# Patient Record
Sex: Male | Born: 1990 | Race: White | Hispanic: No | Marital: Single | Smoking: Never smoker
Health system: Southern US, Community
[De-identification: ages and names within clinical notes are randomized; demographics above are authoritative.]

## PROBLEM LIST (undated history)

## (undated) DIAGNOSIS — C801 Malignant (primary) neoplasm, unspecified: Secondary | ICD-10-CM

## (undated) DIAGNOSIS — Z923 Personal history of irradiation: Secondary | ICD-10-CM

## (undated) HISTORY — DX: Personal history of irradiation: Z92.3

---
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
## 2010-04-20 ENCOUNTER — Other Ambulatory Visit (HOSPITAL_COMMUNITY): Payer: Self-pay | Admitting: Oncology

## 2010-04-20 DIAGNOSIS — C8590 Non-Hodgkin lymphoma, unspecified, unspecified site: Secondary | ICD-10-CM

## 2010-04-23 ENCOUNTER — Encounter: Payer: Self-pay | Admitting: Oncology

## 2010-05-12 ENCOUNTER — Encounter (HOSPITAL_BASED_OUTPATIENT_CLINIC_OR_DEPARTMENT_OTHER): Payer: 59 | Admitting: Oncology

## 2010-05-12 ENCOUNTER — Other Ambulatory Visit: Payer: Self-pay | Admitting: Oncology

## 2010-05-12 DIAGNOSIS — C8582 Other specified types of non-Hodgkin lymphoma, intrathoracic lymph nodes: Secondary | ICD-10-CM

## 2010-05-12 LAB — CBC WITH DIFFERENTIAL/PLATELET
Basophils Absolute: 0 10*3/uL (ref 0.0–0.1)
EOS%: 0.5 % (ref 0.0–7.0)
HCT: 47.1 % (ref 38.4–49.9)
HGB: 15.7 g/dL (ref 13.0–17.1)
MCH: 30.5 pg (ref 27.2–33.4)
MCV: 91.4 fL (ref 79.3–98.0)
NEUT%: 75.2 % — ABNORMAL HIGH (ref 39.0–75.0)
lymph#: 1.5 10*3/uL (ref 0.9–3.3)

## 2010-05-12 LAB — COMPREHENSIVE METABOLIC PANEL
AST: 22 U/L (ref 0–37)
Alkaline Phosphatase: 98 U/L (ref 39–117)
BUN: 16 mg/dL (ref 6–23)
Glucose, Bld: 96 mg/dL (ref 70–99)
Sodium: 141 mEq/L (ref 135–145)
Total Bilirubin: 1 mg/dL (ref 0.3–1.2)
Total Protein: 7.1 g/dL (ref 6.0–8.3)

## 2010-05-15 ENCOUNTER — Encounter (HOSPITAL_COMMUNITY): Payer: Self-pay

## 2010-05-15 ENCOUNTER — Encounter (HOSPITAL_COMMUNITY)
Admission: RE | Admit: 2010-05-15 | Discharge: 2010-05-15 | Disposition: A | Discharge status: Home / Self Care | Payer: 59 | Source: Ambulatory Visit | Attending: Oncology | Admitting: Oncology

## 2010-05-15 DIAGNOSIS — C8589 Other specified types of non-Hodgkin lymphoma, extranodal and solid organ sites: Secondary | ICD-10-CM | POA: Insufficient documentation

## 2010-05-15 DIAGNOSIS — C8590 Non-Hodgkin lymphoma, unspecified, unspecified site: Secondary | ICD-10-CM

## 2010-05-15 HISTORY — DX: Malignant (primary) neoplasm, unspecified: C80.1

## 2010-05-15 MED ORDER — FLUDEOXYGLUCOSE F - 18 (FDG) INJECTION
16.6000 | Freq: Once | INTRAVENOUS | Status: AC | PRN
  Administered 2010-05-15: 16.6 via INTRAVENOUS

## 2010-05-17 ENCOUNTER — Encounter: Payer: Self-pay | Admitting: Family Medicine

## 2010-05-17 ENCOUNTER — Encounter (HOSPITAL_BASED_OUTPATIENT_CLINIC_OR_DEPARTMENT_OTHER): Payer: 59 | Admitting: Oncology

## 2010-05-17 DIAGNOSIS — C8582 Other specified types of non-Hodgkin lymphoma, intrathoracic lymph nodes: Secondary | ICD-10-CM

## 2010-05-30 NOTE — Letter (Signed)
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

## 2010-06-19 LAB — COMPREHENSIVE METABOLIC PANEL
ALT: 25 U/L (ref 0–53)
CO2: 29 mEq/L (ref 19–32)
Calcium: 10.2 mg/dL (ref 8.4–10.5)
Creatinine, Ser: 0.92 mg/dL (ref 0.4–1.5)
GFR calc non Af Amer: >60 mL/min (ref >60)
Glucose, Bld: 74 mg/dL (ref 70–99)
Sodium: 142 mEq/L (ref 135–145)
Total Protein: 6.5 g/dL (ref 6.0–8.3)

## 2010-06-19 LAB — CBC
Hemoglobin: 15 g/dL (ref 13.0–17.0)
MCHC: 34.8 g/dL (ref 30.0–36.0)
MCV: 88.9 fL (ref 78.0–100.0)
RDW: 13 % (ref 11.5–15.5)

## 2010-06-19 LAB — APTT: aPTT: 31 seconds (ref 24–37)

## 2010-06-19 LAB — PROTIME-INR
INR: 1.12 (ref 0.00–1.49)
Prothrombin Time: 14.3 seconds (ref 11.6–15.2)

## 2010-06-26 LAB — GLUCOSE, CAPILLARY: Glucose-Capillary: 117 mg/dL — ABNORMAL HIGH (ref 70–99)

## 2010-07-06 LAB — CBC
HCT: 30 % — ABNORMAL LOW (ref 39.0–52.0)
HCT: 32 % — ABNORMAL LOW (ref 39.0–52.0)
HCT: 33.6 % — ABNORMAL LOW (ref 39.0–52.0)
HCT: 36 % — ABNORMAL LOW (ref 39.0–52.0)
HCT: 41.4 % (ref 39.0–52.0)
Hemoglobin: 11 g/dL — ABNORMAL LOW (ref 13.0–17.0)
Hemoglobin: 12.4 g/dL — ABNORMAL LOW (ref 13.0–17.0)
Hemoglobin: 13.4 g/dL (ref 13.0–17.0)
Hemoglobin: 14.1 g/dL (ref 13.0–17.0)
MCHC: 35.2 g/dL (ref 30.0–36.0)
MCHC: 34.7 g/dL (ref 30.0–36.0)
MCV: 85.6 fL (ref 78.0–100.0)
MCV: 88.4 fL (ref 78.0–100.0)
Platelets: 254 10*3/uL (ref 150–400)
Platelets: 260 10*3/uL (ref 150–400)
Platelets: 229 10*3/uL (ref 150–400)
Platelets: 260 10*3/uL (ref 150–400)
RBC: 3.5 MIL/uL — ABNORMAL LOW (ref 4.22–5.81)
RBC: 3.7 MIL/uL — ABNORMAL LOW (ref 4.22–5.81)
RBC: 3.8 MIL/uL — ABNORMAL LOW (ref 4.22–5.81)
RBC: 3.8 MIL/uL — ABNORMAL LOW (ref 4.22–5.81)
RBC: 4.53 MIL/uL (ref 4.22–5.81)
RBC: 4.68 MIL/uL (ref 4.22–5.81)
RDW: 11.6 % (ref 11.5–15.5)
RDW: 12.1 % (ref 11.5–15.5)
WBC: 5.9 10*3/uL (ref 4.0–10.5)
WBC: 6.2 10*3/uL (ref 4.0–10.5)
WBC: 6.8 10*3/uL (ref 4.0–10.5)
WBC: 13.6 10*3/uL — ABNORMAL HIGH (ref 4.0–10.5)
WBC: 7.9 10*3/uL (ref 4.0–10.5)
WBC: 9.7 10*3/uL (ref 4.0–10.5)

## 2010-07-06 LAB — COMPREHENSIVE METABOLIC PANEL
ALT: 26 U/L (ref 0–53)
ALT: 37 U/L (ref 0–53)
ALT: 14 U/L (ref 0–53)
ALT: 17 U/L (ref 0–53)
AST: 29 U/L (ref 0–37)
AST: 24 U/L (ref 0–37)
AST: 25 U/L (ref 0–37)
Albumin: 2.7 g/dL — ABNORMAL LOW (ref 3.5–5.2)
Albumin: 2.6 g/dL — ABNORMAL LOW (ref 3.5–5.2)
Albumin: 2.9 g/dL — ABNORMAL LOW (ref 3.5–5.2)
Albumin: 3.9 g/dL (ref 3.5–5.2)
Alkaline Phosphatase: 79 U/L (ref 39–117)
Alkaline Phosphatase: 79 U/L (ref 39–117)
Alkaline Phosphatase: 81 U/L (ref 39–117)
BUN: 15 mg/dL (ref 6–23)
BUN: 6 mg/dL (ref 6–23)
CO2: 25 mEq/L (ref 19–32)
CO2: 27 mEq/L (ref 19–32)
CO2: 30 mEq/L (ref 19–32)
Calcium: 8.6 mg/dL (ref 8.4–10.5)
Chloride: 107 mEq/L (ref 96–112)
Chloride: 107 mEq/L (ref 96–112)
Chloride: 107 mEq/L (ref 96–112)
Chloride: 101 mEq/L (ref 96–112)
Chloride: 103 mEq/L (ref 96–112)
Creatinine, Ser: 0.5 mg/dL (ref 0.4–1.5)
Creatinine, Ser: 0.7 mg/dL (ref 0.4–1.5)
Creatinine, Ser: 0.72 mg/dL (ref 0.4–1.5)
Creatinine, Ser: 1.1 mg/dL (ref 0.4–1.5)
GFR calc Af Amer: >60 mL/min (ref >60)
GFR calc Af Amer: >60 mL/min (ref >60)
GFR calc Af Amer: >60 mL/min (ref >60)
GFR calc non Af Amer: >60 mL/min (ref >60)
GFR calc non Af Amer: >60 mL/min (ref >60)
GFR calc non Af Amer: >60 mL/min (ref >60)
Glucose, Bld: 117 mg/dL — ABNORMAL HIGH (ref 70–99)
Glucose, Bld: 108 mg/dL — ABNORMAL HIGH (ref 70–99)
Potassium: 3.8 mEq/L (ref 3.5–5.1)
Potassium: 3.9 mEq/L (ref 3.5–5.1)
Potassium: 3.3 mEq/L — ABNORMAL LOW (ref 3.5–5.1)
Potassium: 4 mEq/L (ref 3.5–5.1)
Sodium: 139 mEq/L (ref 135–145)
Sodium: 137 mEq/L (ref 135–145)
Sodium: 140 mEq/L (ref 135–145)
Total Bilirubin: 0.4 mg/dL (ref 0.3–1.2)
Total Bilirubin: 0.5 mg/dL (ref 0.3–1.2)
Total Bilirubin: 0.7 mg/dL (ref 0.3–1.2)
Total Bilirubin: 0.4 mg/dL (ref 0.3–1.2)
Total Bilirubin: 0.6 mg/dL (ref 0.3–1.2)
Total Protein: 5.8 g/dL — ABNORMAL LOW (ref 6.0–8.3)
Total Protein: 5.5 g/dL — ABNORMAL LOW (ref 6.0–8.3)

## 2010-07-06 LAB — BLOOD GAS, ARTERIAL
Bicarbonate: 24.3 mEq/L — ABNORMAL HIGH (ref 20.0–24.0)
TCO2: 25.4 mmol/L (ref 0–100)
pCO2 arterial: 37.1 mmHg (ref 35.0–45.0)
pH, Arterial: 7.432 (ref 7.350–7.450)
pO2, Arterial: 115 mmHg — ABNORMAL HIGH (ref 80.0–100.0)

## 2010-07-06 LAB — DIFFERENTIAL
Band Neutrophils: 0 % (ref 0–10)
Blasts: 0 %
Metamyelocytes Relative: 0 %
Monocytes Absolute: 0.4 10*3/uL (ref 0.1–1.0)
Myelocytes: 0 %
Promyelocytes Absolute: 0 %
nRBC: 0 /100 WBC

## 2010-07-06 LAB — LACTATE DEHYDROGENASE
LDH: 212 U/L (ref 94–250)
LDH: 230 U/L (ref 94–250)

## 2010-07-06 LAB — URIC ACID
Uric Acid, Serum: 3.1 mg/dL — ABNORMAL LOW (ref 4.0–7.8)
Uric Acid, Serum: 3.5 mg/dL — ABNORMAL LOW (ref 4.0–7.8)

## 2010-07-06 LAB — CHROMOSOME ANALYSIS, BONE MARROW

## 2010-07-06 LAB — BASIC METABOLIC PANEL
Calcium: 9.3 mg/dL (ref 8.4–10.5)
GFR calc Af Amer: >60 mL/min (ref >60)
GFR calc non Af Amer: >60 mL/min (ref >60)
GFR calc non Af Amer: >60 mL/min (ref >60)
Glucose, Bld: 118 mg/dL — ABNORMAL HIGH (ref 70–99)
Potassium: 4.1 mEq/L (ref 3.5–5.1)
Sodium: 134 mEq/L — ABNORMAL LOW (ref 135–145)
Sodium: 139 mEq/L (ref 135–145)

## 2010-07-06 LAB — ABO/RH: ABO/RH(D): O POS

## 2010-07-06 LAB — CROSSMATCH: Antibody Screen: NEGATIVE

## 2010-07-06 LAB — BONE MARROW EXAM

## 2010-07-06 LAB — PROTIME-INR: INR: 1.18 (ref 0.00–1.49)

## 2010-07-06 LAB — GLUCOSE, CAPILLARY: Glucose-Capillary: 96 mg/dL (ref 70–99)

## 2010-08-15 NOTE — Assessment & Plan Note (Signed)
OFFICE VISIT   Shevlin, Daniel Campbell  DOB:  28-Dec-1990                                        September 02, 2009  CHART #:  [id number]   The patient came for follow today.  His blood pressure is 114/66, pulse  90, respirations 16, sats 100.  He is doing well overall.  His Port-A-  Cath site looks good.  It is healing well where we removed the Port-A-  Cath yesterday.  I will see him back again if he has any future problem.   Ines Bloomer, M.D.  Electronically Signed   DPB/MEDQ  D:  09/02/2009  T:  09/03/2009  Job:  [id number]

## 2010-08-15 NOTE — Letter (Signed)
March 22, 2009   Drue Second, MD  [address] [place], [place] [address]   Re:  DERMOT, GREMILLION             DOB:  May 11, 1990   Dear Konrad Dolores,   I saw the patient in our office today and he looks great.  His Port-A-  Cath site and his pericardial incision site look well.  His chest x-ray  shows complete resolution of his mediastinal mass.  It looks like he has  gotten a good response to his chemotherapy and he has 2 more cycles to  go.  His blood pressure was 130/79, pulse 100, respirations 18, and sats  were 99%.  I will see him back again when he needs to have his Port-A-  Cath removed.   Sincerely,   Ines Bloomer, M.D.  Electronically Signed   DPB/MEDQ  D:  03/22/2009  T:  03/22/2009  Job:  [id number]

## 2010-08-15 NOTE — Letter (Signed)
January 05, 2009   Drue Second, MD  [address] [place], [place] [address]   Re:  ZAY, YEARGAN             DOB:  1991/01/14   Dear Konrad Dolores,   I appreciate the opportunity of seeing the patient.  This 20 year old  Caucasian male was having some chest pain and bronchitis and went to the  [facility] where a chest x-ray was obtained and showed  that he had a large mediastinal mass.  A CT scan was done at [facility], which  showed a 15 x 8.3 x 11.7 mass in the superior mediastinum.  There is no  evidence of any other adenopathy.  He was also noticed to have  apparently a murmur and at the same time he used to complain of a cough  and had previous physicals with no evidence of a murmur and he  complained of night sweats, no weight loss and no change in his energy  level.   MEDICATIONS:  He has been on minocycline and he has no allergies.   PAST MEDICAL HISTORY:  Noncontributory.   FAMILY HISTORY:  Noncontributory.   SOCIAL HISTORY:  He is single.  Occasional alcohol intake.   REVIEW OF SYSTEMS:  GENERAL:  He is 127 pounds.  He is 5 feet 8 inches.  He does say that he has some mild weight loss.  HEART:  He has had a heart murmur, and shortness of breath.  PULMONARY:  No hemoptysis or asthma.  GI:  No nausea, vomiting, constipation, or diarrhea.  GU:  No kidney disease, dysuria or frequent urination.  VASCULAR:  No claudication, DVT, or TIAs.  NEUROLOGIC:  No dizziness, headaches, blackouts, or seizures.  MUSCULOSKELETAL:  No arthritis or joint pain.  PSYCHIATRIC:  No depression or nervousness.  EYE/ENT:  No change in his eyesight or hearing.  HEMATOLOGIC:  No problems with bleeding, clotting disorders or anemia.   PHYSICAL EXAMINATION:  General:  He is a well-developed, Caucasian male  in no acute distress.  Head, Eyes, Ears, Nose and Throat:  Unremarkable.  Neck:  Supple without thyromegaly.  There is no supraclavicular or  axillary adenopathy.  Chest:  Clear to  auscultation and percussion.  Heart:  Regular sinus rhythm.  Questionable early systolic ejection  murmur.  Abdomen:  Soft.  Bowel sounds are normal.  Extremities:  Pulses  are 2+.  No clubbing or edema.  Neurologic:  She is oriented x3.  Sensory and motor intact.  Cranial nerves intact.   IMPRESSION:  I think he probably has either a thymoma or Hodgkin's or  non-Hodgkin lymphoma, germ cell tumor or possible teratoma.  He will get  a PET scan tomorrow.  He will see Dr. Antoine Poche for a cardiac evaluation  after a 2-D echo and then plan to do a left mediastinotomy on Monday for  diagnosis.  I appreciate the opportunity of seeing the patient.   Sincerely,   Ines Bloomer, M.D.  Electronically Signed   DPB/MEDQ  D:  01/05/2009  T:  01/06/2009  Job:  [id number]   cc:   Rollene Rotunda, MD, [facility]  Crawford Givens, MD

## 2010-08-15 NOTE — Letter (Signed)
January 25, 2009   Drue Second, MD  [address] [place], [place] [address]   Re:  Daniel Campbell, Daniel Campbell             DOB:  [birth year]   Dear Konrad Dolores,   The patient came for suture removal today.  His incisions were well  healed.  We released him to full activities.  Blood pressure is 116/69,  pulse 78, respirations 18, sats were 99%.  Chest x-ray showed marked  reduction in the mediastinal mass, so he has responded to his first  treatment and chemotherapy.  I will see him back again in 4-6 weeks with  a chest x-ray for final check.   Ines Bloomer, M.D.  Electronically Signed   DPB/MEDQ  D:  01/25/2009  T:  01/26/2009  Job:  [id number]

## 2010-10-25 ENCOUNTER — Other Ambulatory Visit: Payer: Self-pay | Admitting: Oncology

## 2010-10-25 DIAGNOSIS — C8590 Non-Hodgkin lymphoma, unspecified, unspecified site: Secondary | ICD-10-CM

## 2010-10-30 ENCOUNTER — Encounter (HOSPITAL_BASED_OUTPATIENT_CLINIC_OR_DEPARTMENT_OTHER): Payer: 59 | Admitting: Oncology

## 2010-10-30 ENCOUNTER — Other Ambulatory Visit: Payer: Self-pay | Admitting: Oncology

## 2010-10-30 ENCOUNTER — Encounter (HOSPITAL_COMMUNITY): Payer: Self-pay

## 2010-10-30 ENCOUNTER — Ambulatory Visit (HOSPITAL_COMMUNITY)
Admission: RE | Admit: 2010-10-30 | Discharge: 2010-10-30 | Disposition: A | Discharge status: Home / Self Care | Payer: 59 | Source: Ambulatory Visit | Attending: Oncology | Admitting: Oncology

## 2010-10-30 DIAGNOSIS — C8589 Other specified types of non-Hodgkin lymphoma, extranodal and solid organ sites: Secondary | ICD-10-CM | POA: Insufficient documentation

## 2010-10-30 DIAGNOSIS — C8582 Other specified types of non-Hodgkin lymphoma, intrathoracic lymph nodes: Secondary | ICD-10-CM

## 2010-10-30 DIAGNOSIS — C8590 Non-Hodgkin lymphoma, unspecified, unspecified site: Secondary | ICD-10-CM

## 2010-10-30 LAB — CMP (CANCER CENTER ONLY)
ALT(SGPT): 24 U/L (ref 10–47)
AST: 26 U/L (ref 11–38)
Albumin: 3.9 g/dL (ref 3.3–5.5)
Alkaline Phosphatase: 94 U/L — ABNORMAL HIGH (ref 26–84)
Potassium: 4.6 mEq/L (ref 3.3–4.7)
Sodium: 141 mEq/L (ref 128–145)
Total Protein: 7 g/dL (ref 6.4–8.1)

## 2010-10-30 LAB — CBC WITH DIFFERENTIAL/PLATELET
EOS%: 3.3 % (ref 0.0–7.0)
MCH: 31.3 pg (ref 27.2–33.4)
MCV: 90 fL (ref 79.3–98.0)
MONO%: 6.5 % (ref 0.0–14.0)
NEUT#: 3.5 10*3/uL (ref 1.5–6.5)
RBC: 4.96 10*6/uL (ref 4.20–5.82)
RDW: 12.7 % (ref 11.0–14.6)

## 2010-10-30 MED ORDER — IOHEXOL 300 MG/ML  SOLN
100.0000 mL | Freq: Once | INTRAMUSCULAR | Status: AC | PRN
  Administered 2010-10-30: 100 mL via INTRAVENOUS

## 2010-10-31 ENCOUNTER — Other Ambulatory Visit: Payer: Self-pay | Admitting: Hematology & Oncology

## 2010-10-31 ENCOUNTER — Encounter (HOSPITAL_BASED_OUTPATIENT_CLINIC_OR_DEPARTMENT_OTHER): Payer: 59

## 2010-10-31 DIAGNOSIS — C8582 Other specified types of non-Hodgkin lymphoma, intrathoracic lymph nodes: Secondary | ICD-10-CM

## 2010-12-11 IMAGING — CR DG CHEST 2V
2 series · 2 of 2 positions shown · non-contrast
Comparison: PET CT 01/06/2009

CLINICAL DATA: Preoperative, mediastinal mass

CHEST - 2 VIEW

[w chest pa]
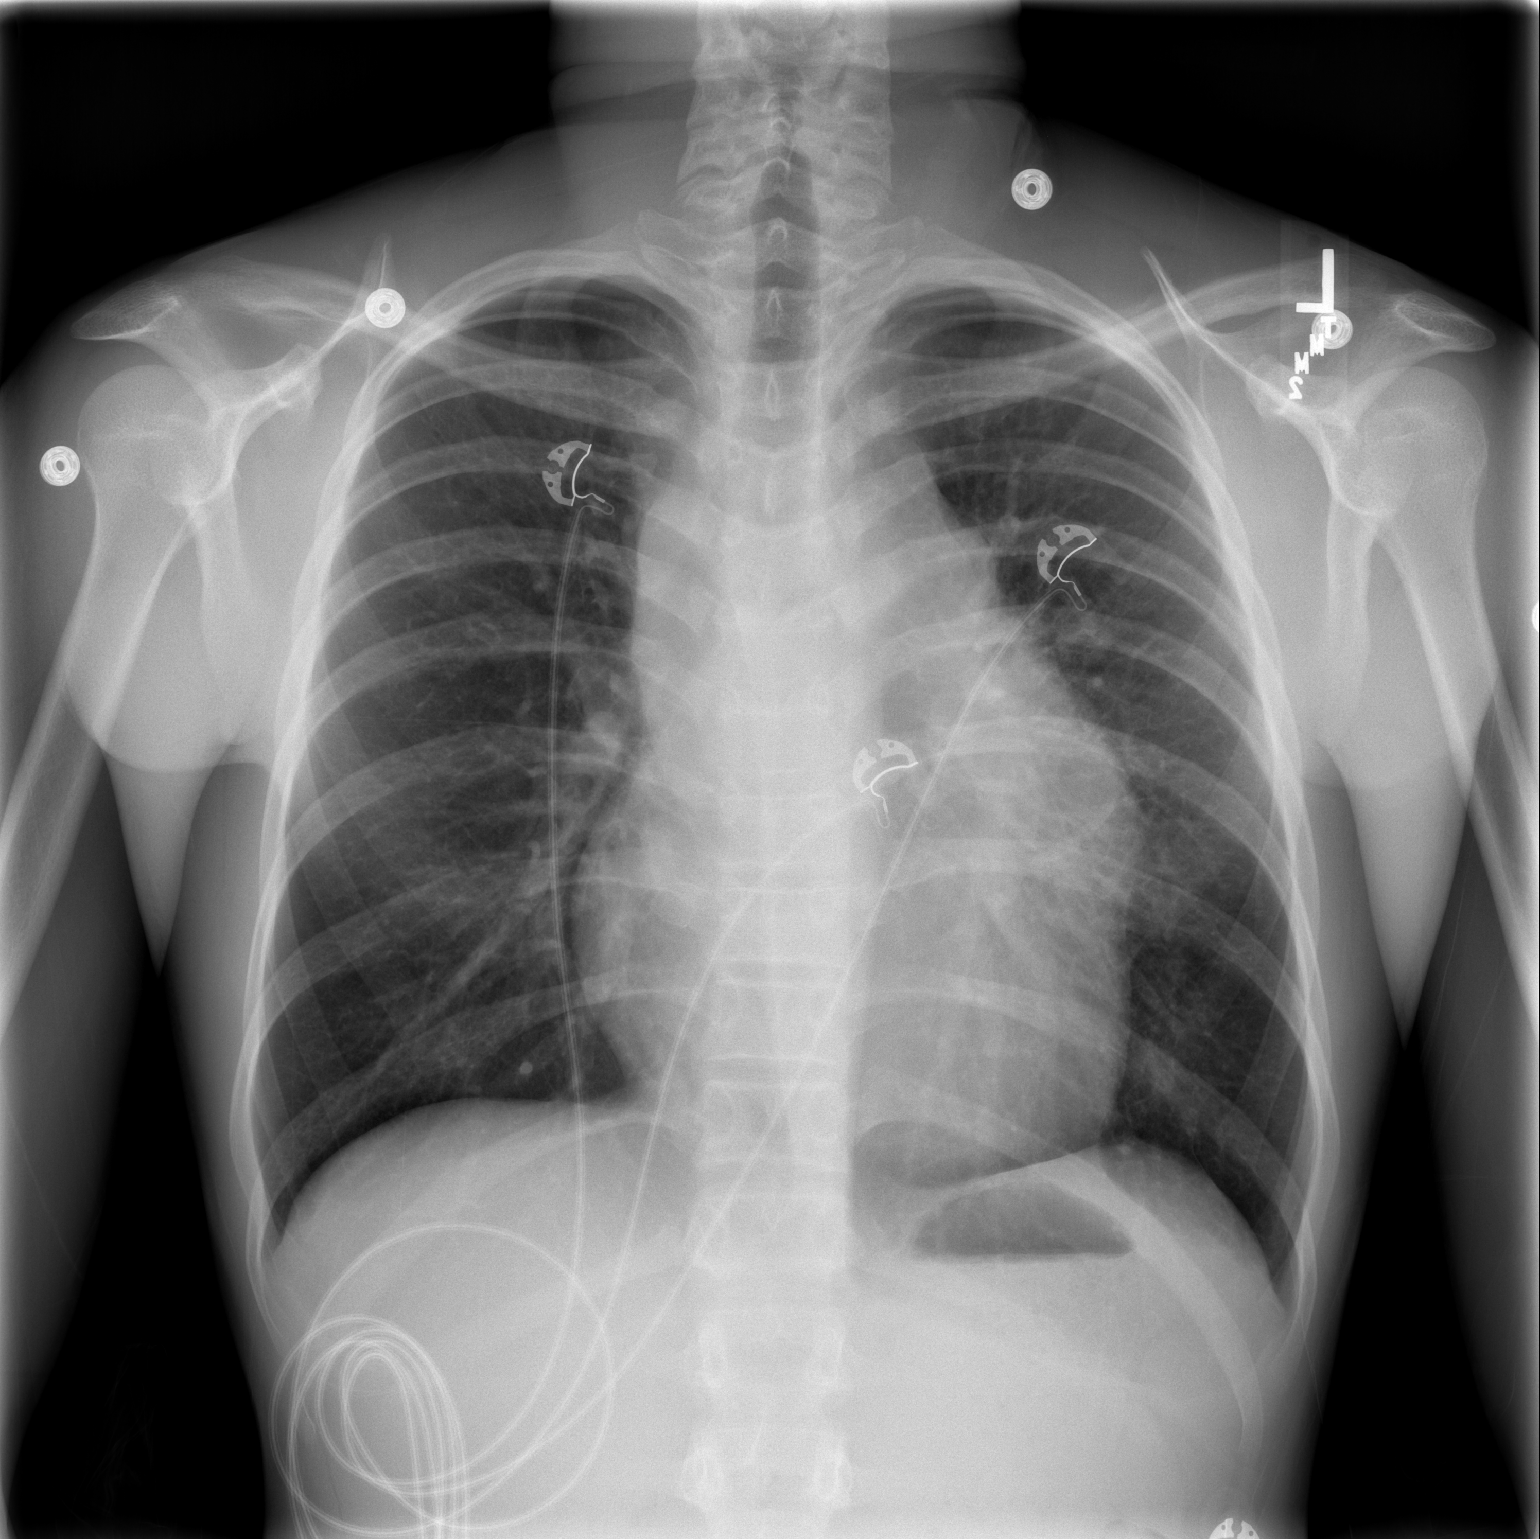

[w chest lat]
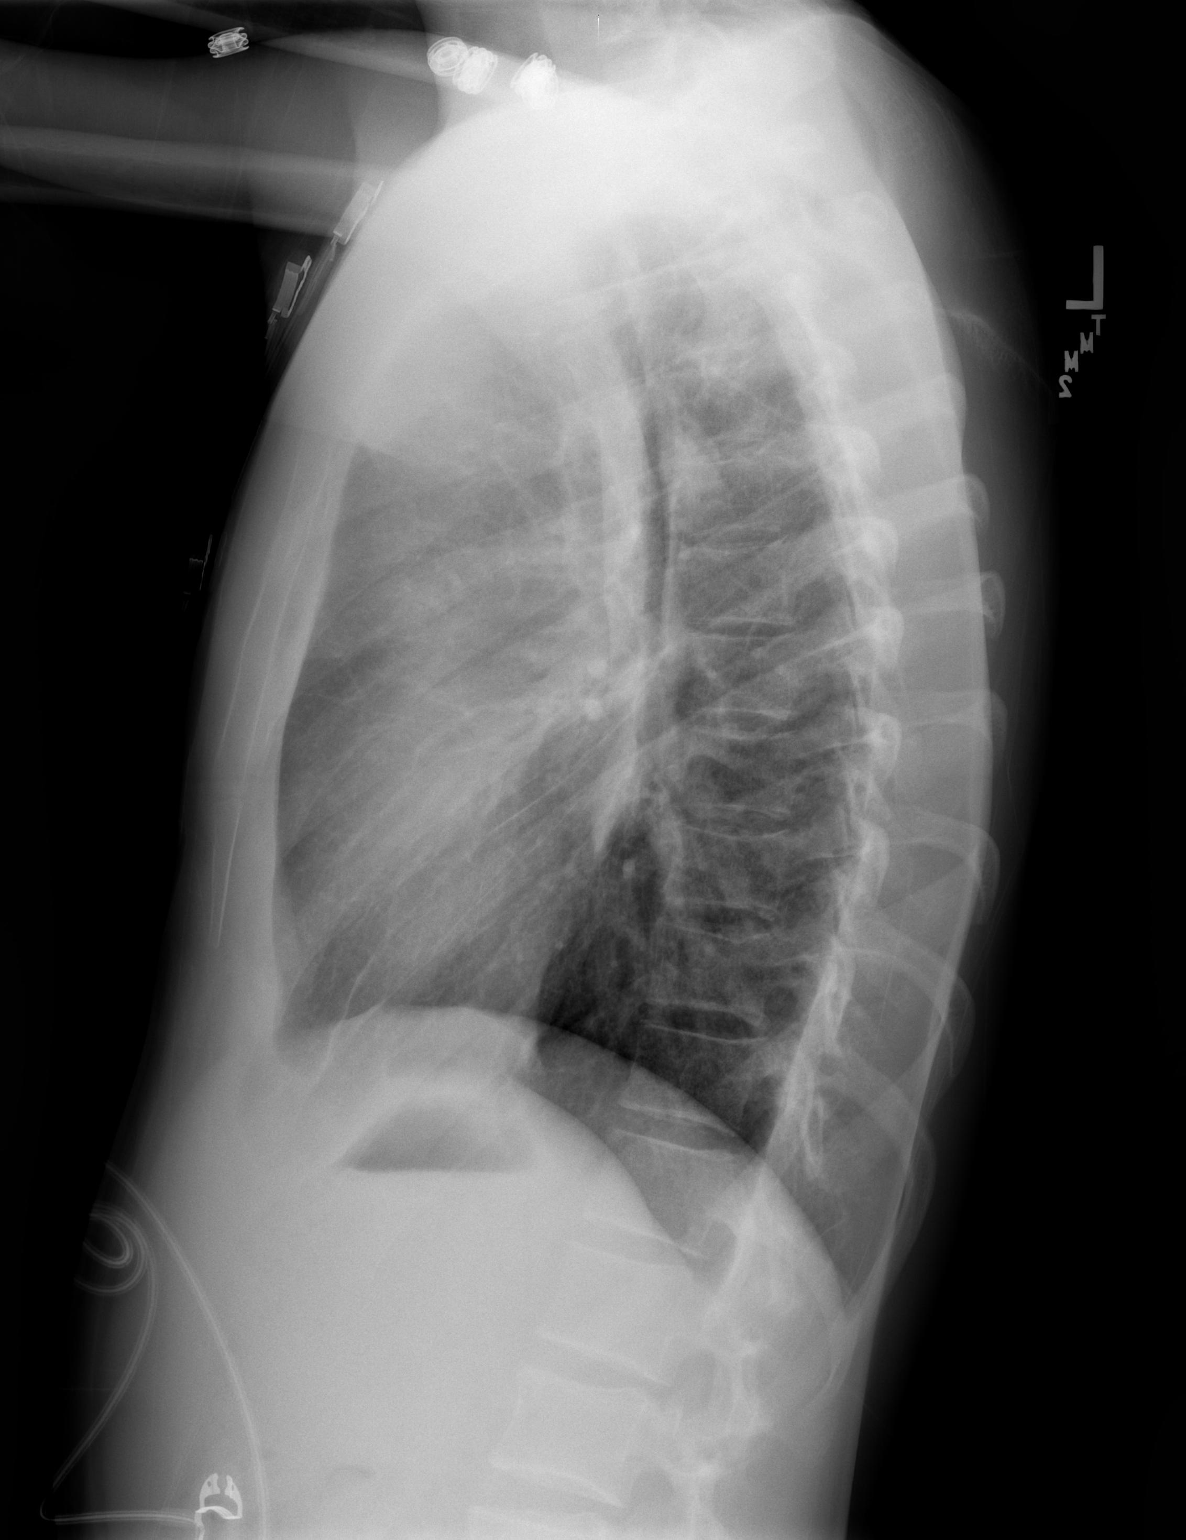

[2 of 2 positions shown; findings below may reference images not displayed]

FINDINGS: Abnormal cardiomediastinal silhouette is compatible with
previously seen anterior mediastinal mass.  Lungs are clear.  No
pleural effusion.
IMPRESSION: No acute finding.  Anterior mediastinal mass again noted.

## 2010-12-13 IMAGING — CT CT PELVIS W/ CM
2 of 4 series · 14 of 32 positions shown, 19 images · IV contrast (omnipaque)
Comparison: PET CT 01/06/2009

CT ABDOMEN

CLINICAL DATA: Mediastinal mass status post excision, staging of
seminoma

CT ABDOMEN AND PELVIS WITH CONTRAST
TECHNIQUE: Multidetector CT imaging of the abdomen and pelvis was
performed using the standard protocol following bolus
administration of intravenous contrast. Breast shield utilized.
Sagittal and coronal MPR images reconstructed from axial data set.
Contrast: Dilute oral contrast and 80 ml Omnipaque 300

[Series 2: routine abdomen · axial · 0.69mm/px · z∈[-403,-68]mm · 8 of 87 slices shown, 13 images]
[im 10/87  soft-tissue]
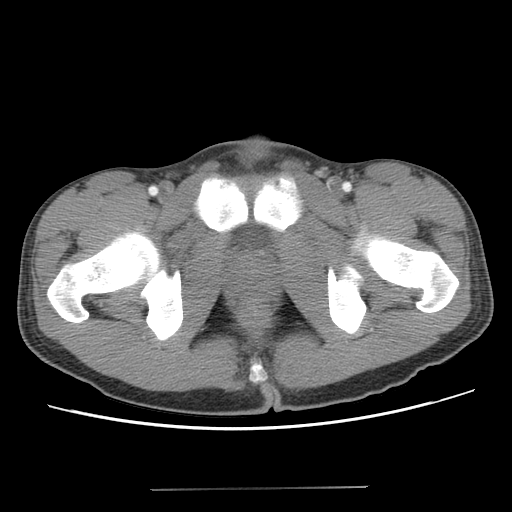
[im 10/87  bone]
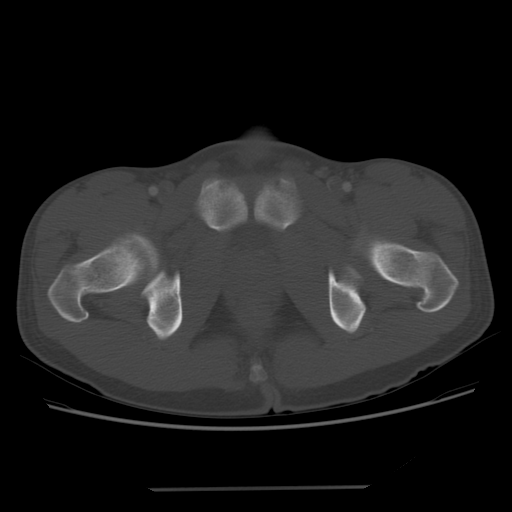
[im 20/87  soft-tissue]
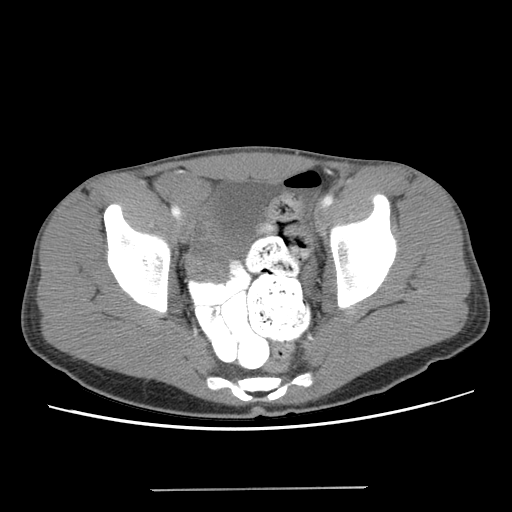
[im 29/87  soft-tissue]
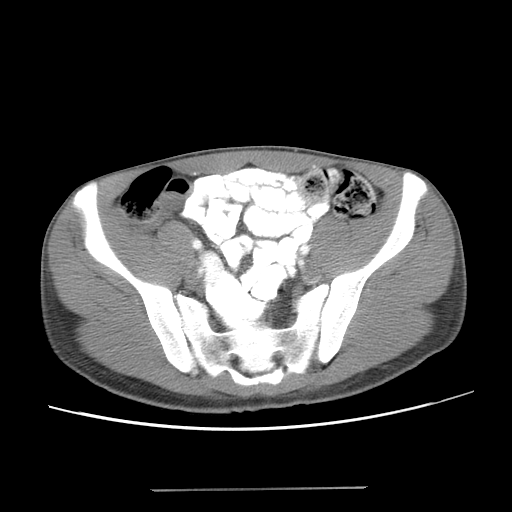
[im 39/87  soft-tissue]
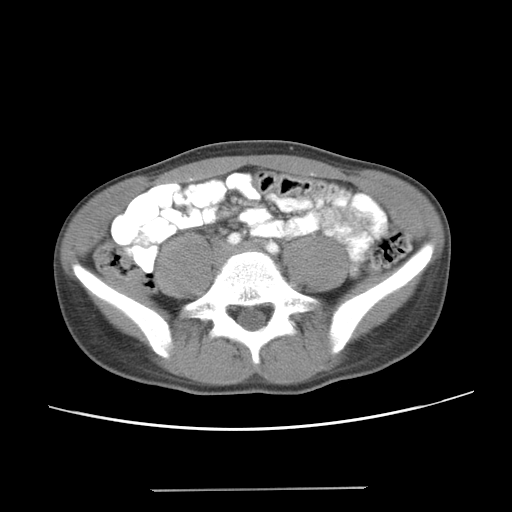
[im 48/87  soft-tissue]
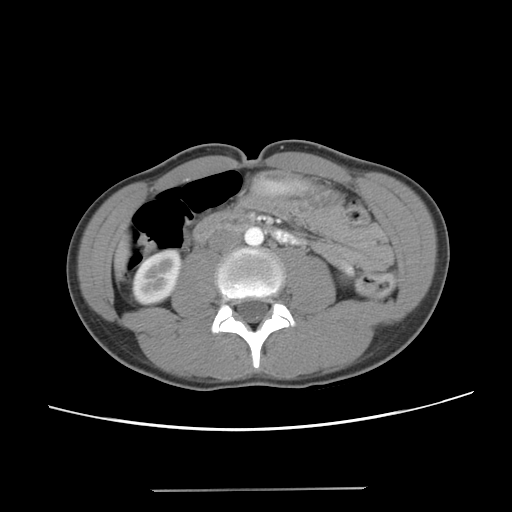
[im 48/87  lung]
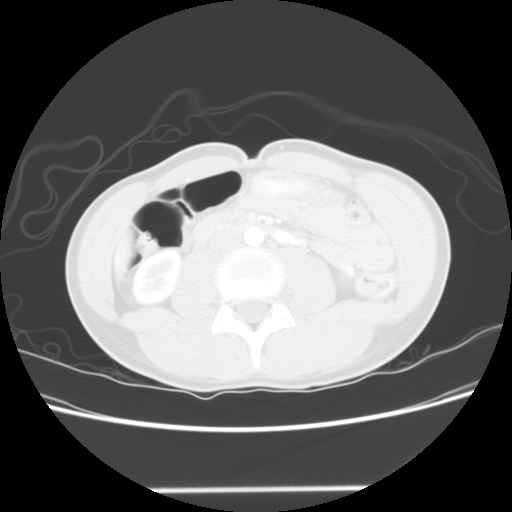
[im 58/87  soft-tissue]
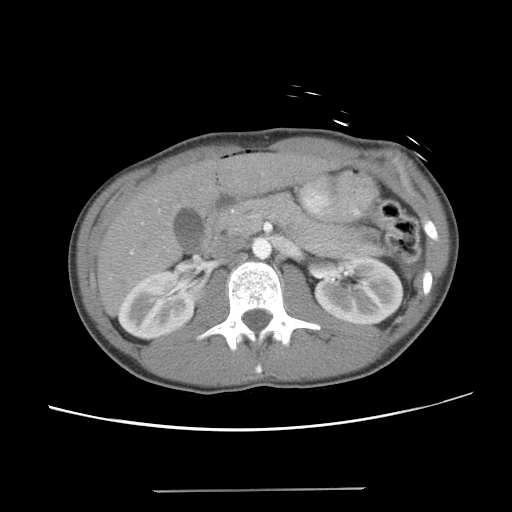
[im 58/87  lung]
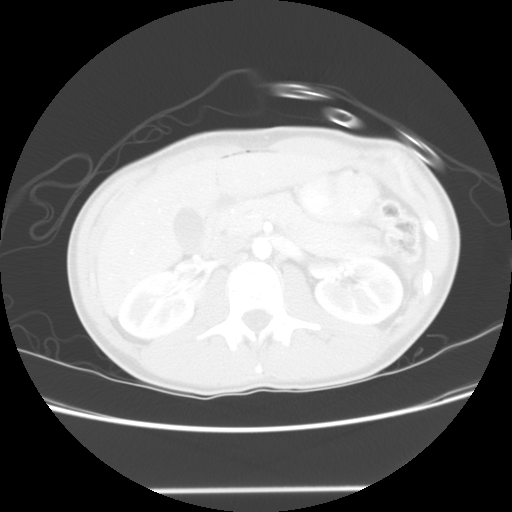
[im 67/87  soft-tissue]
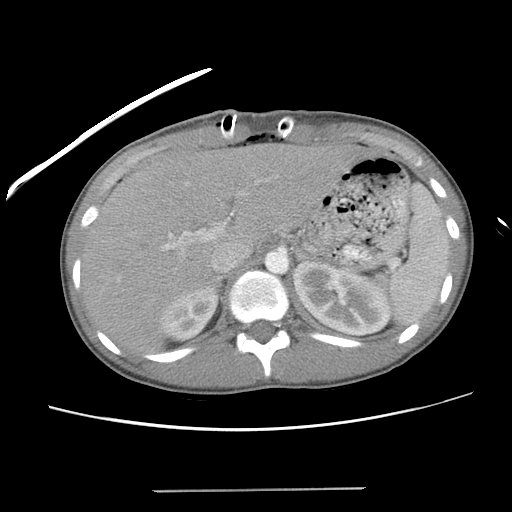
[im 67/87  lung]
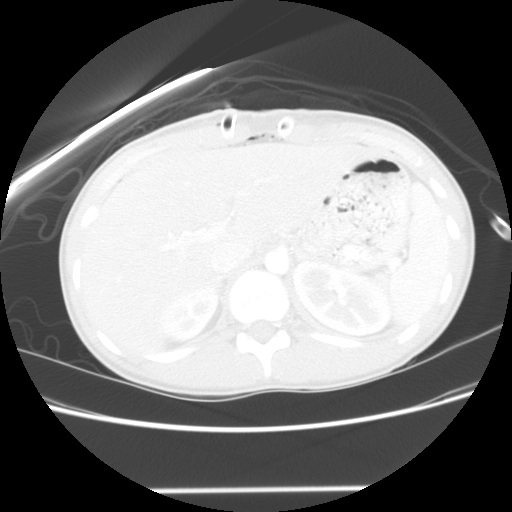
[im 77/87  soft-tissue]
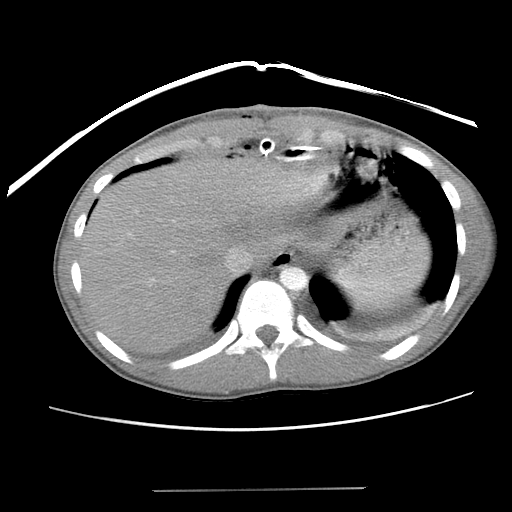
[im 77/87  lung]
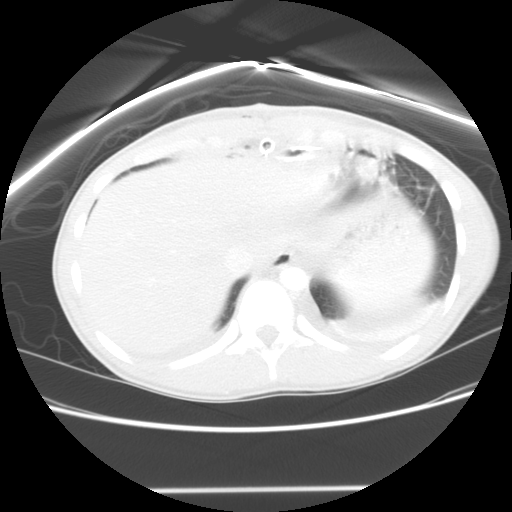

[Series 400: reformatted · sagittal · 0.86mm/px · 6 of 88 slices shown]
[im 10/88  soft-tissue]
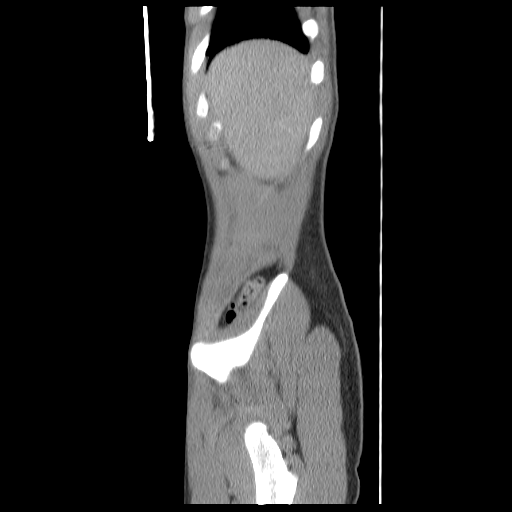
[im 20/88  soft-tissue]
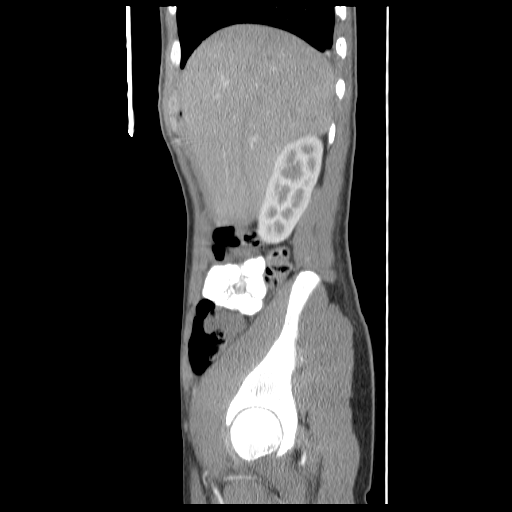
[im 30/88  soft-tissue]
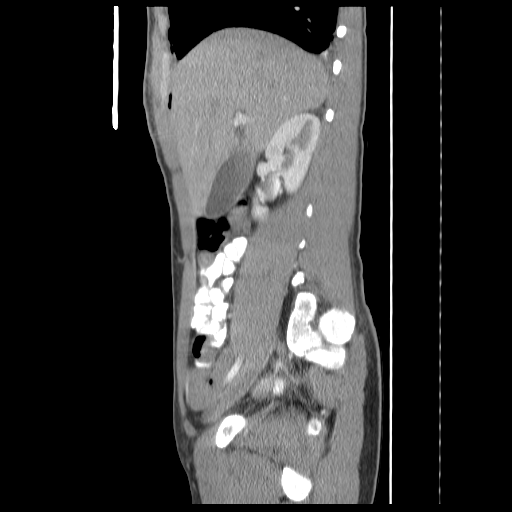
[im 39/88  soft-tissue]
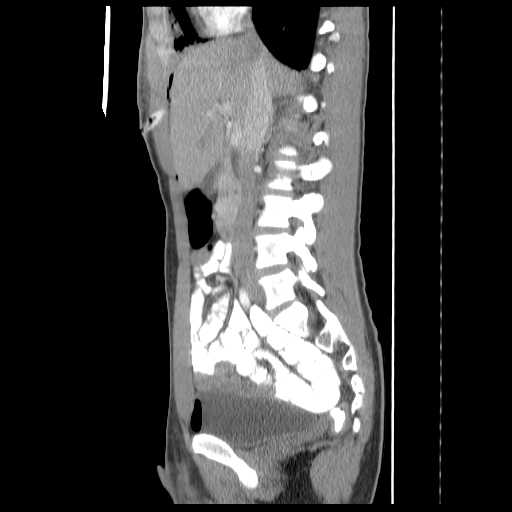
[im 49/88  soft-tissue]
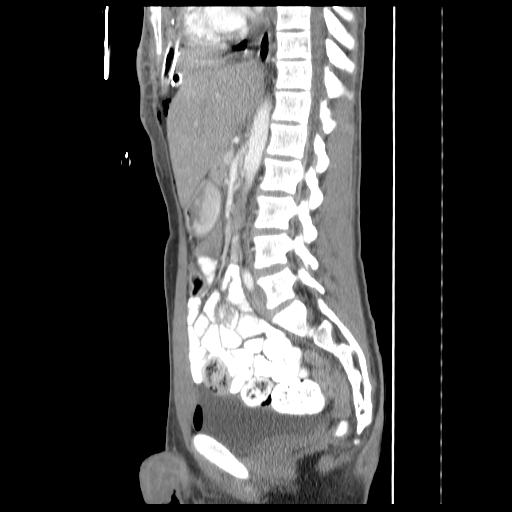
[im 59/88  soft-tissue]
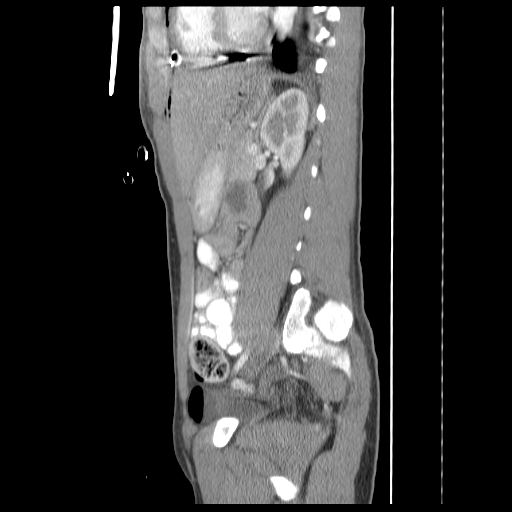

[14 of 32 positions shown; findings below may reference images not displayed]

FINDINGS: Mediastinal drainage catheters, soft tissue swelling and gas from
mediastinal tumor excision.
Bibasilar atelectasis and small left pleural effusion.
Tiny foci of free air seen anterior to the liver, likely related to
mediastinal drain placement.
Liver, spleen, pancreas, kidneys, and adrenal glands normal.
Stomach and upper abdominal bowel loops grossly unremarkable.
No upper abdominal mass, adenopathy, free fluid or inflammatory
process.
A few scattered normal-sized retroperitoneal lymph nodes are seen.
None of these were metabolically active on preceding PET CT.
IMPRESSION: Postsurgical changes inferior mediastinum.
No acute upper abdominal abnormalities.

CT PELVIS
FINDINGS: Large and small bowel loops in pelvis unremarkable.
Air in urinary bladder question prior instrumentation.
No pelvic mass, adenopathy, or free fluid.
Specifically no external iliac or inguinal adenopathy are
identified.
No focal bony abnormalities.
IMPRESSION: Negative CT pelvis.

## 2011-05-18 ENCOUNTER — Ambulatory Visit (HOSPITAL_COMMUNITY)
Admission: RE | Admit: 2011-05-18 | Discharge: 2011-05-18 | Disposition: A | Discharge status: Home / Self Care | Payer: 59 | Source: Ambulatory Visit | Attending: Hematology & Oncology | Admitting: Hematology & Oncology

## 2011-05-18 ENCOUNTER — Other Ambulatory Visit: Payer: 59 | Admitting: Lab

## 2011-05-18 ENCOUNTER — Encounter (HOSPITAL_COMMUNITY): Payer: Self-pay

## 2011-05-18 DIAGNOSIS — Z9221 Personal history of antineoplastic chemotherapy: Secondary | ICD-10-CM | POA: Insufficient documentation

## 2011-05-18 DIAGNOSIS — C8589 Other specified types of non-Hodgkin lymphoma, extranodal and solid organ sites: Secondary | ICD-10-CM | POA: Insufficient documentation

## 2011-05-18 DIAGNOSIS — Z923 Personal history of irradiation: Secondary | ICD-10-CM | POA: Insufficient documentation

## 2011-05-18 DIAGNOSIS — C8582 Other specified types of non-Hodgkin lymphoma, intrathoracic lymph nodes: Secondary | ICD-10-CM

## 2011-05-18 LAB — CBC WITH DIFFERENTIAL/PLATELET
Basophils Absolute: 0 10*3/uL (ref 0.0–0.1)
Eosinophils Absolute: 0.1 10*3/uL (ref 0.0–0.5)
HCT: 46.1 % (ref 38.4–49.9)
HGB: 15.9 g/dL (ref 13.0–17.1)
MONO#: 0.4 10*3/uL (ref 0.1–0.9)
NEUT%: 61 % (ref 39.0–75.0)
WBC: 7.4 10*3/uL (ref 4.0–10.3)
lymph#: 2.4 10*3/uL (ref 0.9–3.3)

## 2011-05-18 LAB — CMP (CANCER CENTER ONLY)
BUN, Bld: 13 mg/dL (ref 7–22)
CO2: 29 mEq/L (ref 18–33)
Calcium: 9.5 mg/dL (ref 8.0–10.3)
Chloride: 100 mEq/L (ref 98–108)
Creat: 0.9 mg/dl (ref 0.6–1.2)

## 2011-05-18 MED ORDER — IOHEXOL 300 MG/ML  SOLN
100.0000 mL | Freq: Once | INTRAMUSCULAR | Status: AC | PRN
  Administered 2011-05-18: 100 mL via INTRAVENOUS

## 2011-05-25 ENCOUNTER — Telehealth: Payer: Self-pay | Admitting: Oncology

## 2011-05-25 ENCOUNTER — Ambulatory Visit (HOSPITAL_BASED_OUTPATIENT_CLINIC_OR_DEPARTMENT_OTHER): Payer: 59 | Admitting: Oncology

## 2011-05-25 ENCOUNTER — Encounter: Payer: Self-pay | Admitting: Oncology

## 2011-05-25 DIAGNOSIS — C8582 Other specified types of non-Hodgkin lymphoma, intrathoracic lymph nodes: Secondary | ICD-10-CM

## 2011-05-25 DIAGNOSIS — C8592 Non-Hodgkin lymphoma, unspecified, intrathoracic lymph nodes: Secondary | ICD-10-CM | POA: Insufficient documentation

## 2011-05-25 DIAGNOSIS — I319 Disease of pericardium, unspecified: Secondary | ICD-10-CM

## 2011-05-25 NOTE — Telephone Encounter (Signed)
gv pt appt schedule for aug including scans.

## 2011-05-27 NOTE — Progress Notes (Signed)
  CC: Maryln Gottron, M.D. Ines Bloomer, M.D. 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  PAST THERAPY.:   1. Status post 6 cycles of CHOP-R administered from 01/15/2009 to 05/02/2009. 2. Status post radiation therapy administered from 06/20/2009 to 07/21/2009 to a total of 3170 cGy to mediastinum and 3230 cGy to the subxiphoid tumor bed.  CURRENT THERAPY:  Observation with surveillance scans.  INTERVAL HISTORY:  The patient is seen in followup today.  Clinically, he seems to be doing quite well.  He is without any significant complaints.  .  He has not had any problems whatsoever.  He denies any fevers, chills, night sweats, weight loss.  He has not noticed any masses in his neck, under his arms or the groin region.  He has no hearing loss of sinus infections.  No urinary incontinence.  Denies any back pain, joint pain, tingling or numbness in his extremities.  He has no bleeding problems and remainder of the 10-point review of systems is negative.  CURRENT MEDICATIONS:  None.  ALLERGIES:  No known drug allergies.  PERFORMANCE STATUS:  ECOG performance status is 0.  PHYSICAL EXAMINATION:  The patient is awake, alert, well-developed, well-nourished young male in no acute distress.  Vital Signs:  stable  HEENT:  EOMI.  PERLA.  Sclerae is anicteric.  No conjunctival pallor.  Oral mucosa is moist.  Neck:  Supple.  Lungs:  Clear bilaterally to auscultation and percussion.  Cardiovascular:  Regular rate and rhythm.  No murmurs, gallops or rubs.  Abdomen:  Soft, nontender, nondistended.  Bowel sounds are present.  No HSM.  Extremities:  No clubbing, edema or cyanosis.  Neuro:  Patient is alert, oriented x3.  Strength is symmetrical in upper and lower extremities.  LABORATORY DATA:  WBC 6.0, hemoglobin 15.5, hematocrit 44.6, platelets are 196,000.  RADIOGRAPHIC STUDIES:  CT of the chest, abdomen and  pelvis performed revealed no evidence of recurrent disease.  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  He received CHOP-R chemotherapy followed by consolidative radiation therapy.  All of his treatment was completed in April 2011.  His restaging scan revealed no evidence of recurrent disease.  The   patient overall seems to be doing extremely well.  He will continue to be seen every 6 months for the first 5 years.  We will do surveillance scans on his next visit as well.

## 2011-11-12 ENCOUNTER — Telehealth: Payer: Self-pay | Admitting: *Deleted

## 2011-11-12 NOTE — Telephone Encounter (Signed)
patient called in to reschedule the lab and scan til 11-20-2011 starting at 3:00pm with the labs

## 2011-11-16 ENCOUNTER — Other Ambulatory Visit (HOSPITAL_BASED_OUTPATIENT_CLINIC_OR_DEPARTMENT_OTHER): Payer: 59

## 2011-11-16 ENCOUNTER — Other Ambulatory Visit (HOSPITAL_COMMUNITY): Payer: 59

## 2011-11-20 ENCOUNTER — Ambulatory Visit (HOSPITAL_COMMUNITY)
Admission: RE | Admit: 2011-11-20 | Discharge: 2011-11-20 | Disposition: A | Discharge status: Home / Self Care | Payer: 59 | Source: Ambulatory Visit | Attending: Oncology | Admitting: Oncology

## 2011-11-20 ENCOUNTER — Other Ambulatory Visit: Payer: 59 | Admitting: Lab

## 2011-11-20 DIAGNOSIS — C8589 Other specified types of non-Hodgkin lymphoma, extranodal and solid organ sites: Secondary | ICD-10-CM | POA: Insufficient documentation

## 2011-11-20 DIAGNOSIS — M899 Disorder of bone, unspecified: Secondary | ICD-10-CM | POA: Insufficient documentation

## 2011-11-20 DIAGNOSIS — C8592 Non-Hodgkin lymphoma, unspecified, intrathoracic lymph nodes: Secondary | ICD-10-CM

## 2011-11-20 DIAGNOSIS — Z923 Personal history of irradiation: Secondary | ICD-10-CM | POA: Insufficient documentation

## 2011-11-20 DIAGNOSIS — C8582 Other specified types of non-Hodgkin lymphoma, intrathoracic lymph nodes: Secondary | ICD-10-CM

## 2011-11-20 DIAGNOSIS — Z9221 Personal history of antineoplastic chemotherapy: Secondary | ICD-10-CM | POA: Insufficient documentation

## 2011-11-20 LAB — CBC WITH DIFFERENTIAL/PLATELET
Eosinophils Absolute: 0.1 10*3/uL (ref 0.0–0.5)
HCT: 44.9 % (ref 38.4–49.9)
LYMPH%: 26.2 % (ref 14.0–49.0)
MONO#: 0.4 10*3/uL (ref 0.1–0.9)
NEUT#: 4.8 10*3/uL (ref 1.5–6.5)
NEUT%: 66.1 % (ref 39.0–75.0)
Platelets: 209 10*3/uL (ref 140–400)
WBC: 7.2 10*3/uL (ref 4.0–10.3)

## 2011-11-20 LAB — CMP (CANCER CENTER ONLY)
ALT(SGPT): 25 U/L (ref 10–47)
Albumin: 4.3 g/dL (ref 3.3–5.5)
BUN, Bld: 13 mg/dL (ref 7–22)
CO2: 31 mEq/L (ref 18–33)
Calcium: 9.4 mg/dL (ref 8.0–10.3)
Chloride: 101 mEq/L (ref 98–108)
Creat: 0.8 mg/dl (ref 0.6–1.2)
Potassium: 4.5 mEq/L (ref 3.3–4.7)

## 2011-11-20 LAB — LACTATE DEHYDROGENASE: LDH: 172 U/L (ref 94–250)

## 2011-11-20 MED ORDER — IOHEXOL 300 MG/ML  SOLN
80.0000 mL | Freq: Once | INTRAMUSCULAR | Status: AC | PRN
  Administered 2011-11-20: 80 mL via INTRAVENOUS

## 2011-11-22 ENCOUNTER — Ambulatory Visit (HOSPITAL_BASED_OUTPATIENT_CLINIC_OR_DEPARTMENT_OTHER): Payer: 59 | Admitting: Oncology

## 2011-11-22 ENCOUNTER — Telehealth: Payer: Self-pay | Admitting: Oncology

## 2011-11-22 ENCOUNTER — Encounter: Payer: Self-pay | Admitting: Oncology

## 2011-11-22 VITALS — BP 147/89 | HR 72 | Temp 97.7°F | Resp 20

## 2011-11-22 DIAGNOSIS — C8592 Non-Hodgkin lymphoma, unspecified, intrathoracic lymph nodes: Secondary | ICD-10-CM

## 2011-11-22 DIAGNOSIS — C8582 Other specified types of non-Hodgkin lymphoma, intrathoracic lymph nodes: Secondary | ICD-10-CM

## 2011-11-22 NOTE — Progress Notes (Signed)
  CC: Maryln Gottron, M.D. Ines Bloomer, M.D. Mauri Brooklyn, MD, Fax [phone]   DIAGNOSIS:  A 21 year old male with high-grade B-cell lymphoma of the mediastinum and pericardium.  PAST THERAPY.:   1. Status post 6 cycles of CHOP-R administered from 01/15/2009 to 05/02/2009. 2. Status post radiation therapy administered from 06/20/2009 to 07/21/2009 to a total of 3170 cGy to mediastinum and 3230 cGy to the subxiphoid tumor bed.  CURRENT THERAPY:  Observation with surveillance scans.  INTERVAL HISTORY:  The patient is seen in followup today.  Clinically, he seems to be doing quite well.  He is without any significant complaints. He has not had any problems whatsoever.  He denies any fevers, chills, night sweats, weight loss.  He has not noticed any masses in his neck, under his arms or the groin region.  He has no hearing loss of sinus infections.  No urinary incontinence.  Denies any back pain, joint pain, tingling or numbness in his extremities.  He has no bleeding problems and remainder of the 10-point review of systems is negative.  CURRENT MEDICATIONS:  None.  ALLERGIES:  No known drug allergies.  PERFORMANCE STATUS:  ECOG performance status is 0.  PHYSICAL EXAMINATION:  The patient is awake, alert, well-developed, well-nourished young male in no acute distress.  Vital Signs:  stable  HEENT:  EOMI.  PERLA.  Sclerae is anicteric.  No conjunctival pallor.  Oral mucosa is moist.  Neck:  Supple.  Lungs:  Clear bilaterally to auscultation and percussion.  Cardiovascular:  Regular rate and rhythm.  No murmurs, gallops or rubs.  Abdomen:  Soft, nontender, nondistended.  Bowel sounds are present.  No HSM.  Extremities:  No clubbing, edema or cyanosis.  Neuro:  Patient is alert, oriented x3.  Strength is symmetrical in upper and lower extremities.  LABORATORY DATA:  WBC 6.0, hemoglobin 15.5, hematocrit 44.6, platelets are 196,000.  RADIOGRAPHIC STUDIES:  CT of the chest, abdomen and pelvis  performed revealed no evidence of recurrent disease.  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  He received CHOP-R chemotherapy followed by consolidative radiation therapy.  All of his treatment was completed in April 2011.  His restaging scan revealed no evidence of recurrent disease.  The   patient overall seems to be doing extremely well.  He will continue to be seen every 6 months for the first 5 years.  We will do surveillance scans on his next visit as well.  Drue Second, MD Medical/Oncology [facility] [phone] (beeper) [phone] (Office)  11/22/2011, 3:08 PM

## 2011-11-22 NOTE — Patient Instructions (Addendum)
Doing well  I will see you in 6 months with labs and CT scan  *RADIOLOGY REPORT*  Clinical Data: Follow-up non-Hodgkins lymphoma. Chemotherapy and  radiation therapy completed.  CT CHEST, ABDOMEN AND PELVIS WITH CONTRAST  Technique: Multidetector CT imaging of the chest, abdomen and  pelvis was performed following the standard protocol during bolus  administration of intravenous contrast.  Contrast: 80mL OMNIPAQUE IOHEXOL 300 MG/ML SOLN  Comparison: Prior examinations 05/18/2011 and 10/30/2010.  CT CHEST  Findings: There are no enlarged mediastinal, hilar or axillary  lymph nodes. There is no pleural or pericardial effusion.  The lungs remain clear.  There is stable sclerosis of the right clavicular head at the  sternoclavicular joint. No suspicious osseous findings are seen.  IMPRESSION:  Stable chest CT. No evidence of recurrent lymphoma or active  process.  CT ABDOMEN AND PELVIS  Findings: The spleen remains normal in size and demonstrates no  focal abnormality. The liver, gallbladder, pancreas and biliary  system appear normal. The adrenal glands and kidneys appear  normal. There is no hydronephrosis.  No enlarged abdominal pelvic lymph nodes are demonstrated. There  are no bowel lesions. Vascular structures appear normal.  There are no suspicious osseous findings.  IMPRESSION:  Stable abdominal pelvic CT. No evidence of recurrent lymphoma or  active process.  Original Report Authenticated By: Gerrianne Scale, M.D.    CBC:  Lab 11/20/11 1509  WBC 7.2  NEUTROABS 4.8  HGB 15.3  HCT 44.9  MCV 92.0  PLT 209

## 2011-11-22 NOTE — Telephone Encounter (Signed)
Gave pt appt for February 2014 lab and Ct then MD visit , gave pt oral contrast and NPO 4 hours prior to CT

## 2012-05-23 ENCOUNTER — Other Ambulatory Visit (HOSPITAL_BASED_OUTPATIENT_CLINIC_OR_DEPARTMENT_OTHER): Payer: 59 | Admitting: Lab

## 2012-05-23 ENCOUNTER — Ambulatory Visit (HOSPITAL_COMMUNITY)
Admission: RE | Admit: 2012-05-23 | Discharge: 2012-05-23 | Disposition: A | Discharge status: Home / Self Care | Payer: 59 | Source: Ambulatory Visit | Attending: Oncology | Admitting: Oncology

## 2012-05-23 DIAGNOSIS — Z9221 Personal history of antineoplastic chemotherapy: Secondary | ICD-10-CM | POA: Insufficient documentation

## 2012-05-23 DIAGNOSIS — C8589 Other specified types of non-Hodgkin lymphoma, extranodal and solid organ sites: Secondary | ICD-10-CM | POA: Insufficient documentation

## 2012-05-23 DIAGNOSIS — C8592 Non-Hodgkin lymphoma, unspecified, intrathoracic lymph nodes: Secondary | ICD-10-CM

## 2012-05-23 LAB — COMPREHENSIVE METABOLIC PANEL (CC13)
Albumin: 4.5 g/dL (ref 3.5–5.0)
CO2: 28 mEq/L (ref 22–29)
Chloride: 102 mEq/L (ref 98–107)
Glucose: 97 mg/dl (ref 70–99)
Potassium: 4.5 mEq/L (ref 3.5–5.1)
Sodium: 141 mEq/L (ref 136–145)
Total Protein: 7.8 g/dL (ref 6.4–8.3)

## 2012-05-23 LAB — CBC WITH DIFFERENTIAL/PLATELET
Basophils Absolute: 0 10*3/uL (ref 0.0–0.1)
Eosinophils Absolute: 0.1 10*3/uL (ref 0.0–0.5)
HGB: 15.5 g/dL (ref 13.0–17.1)
MCV: 89.8 fL (ref 79.3–98.0)
NEUT#: 5.2 10*3/uL (ref 1.5–6.5)
RDW: 12.3 % (ref 11.0–14.6)
lymph#: 2.1 10*3/uL (ref 0.9–3.3)

## 2012-05-23 MED ORDER — IOHEXOL 300 MG/ML  SOLN
100.0000 mL | Freq: Once | INTRAMUSCULAR | Status: AC | PRN
  Administered 2012-05-23: 100 mL via INTRAVENOUS

## 2012-05-30 ENCOUNTER — Ambulatory Visit (HOSPITAL_BASED_OUTPATIENT_CLINIC_OR_DEPARTMENT_OTHER): Payer: 59 | Admitting: Oncology

## 2012-05-30 ENCOUNTER — Telehealth: Payer: Self-pay | Admitting: Oncology

## 2012-05-30 ENCOUNTER — Encounter: Payer: Self-pay | Admitting: Oncology

## 2012-05-30 VITALS — BP 126/7 | HR 98 | Temp 98.1°F | Resp 20 | Ht 68.0 in | Wt 153.0 lb

## 2012-05-30 NOTE — Patient Instructions (Addendum)
Doing great  I will see you back in 6 months  CT CHEST, ABDOMEN AND PELVIS WITH CONTRAST  Technique: Multidetector CT imaging of the chest, abdomen and  pelvis was performed following the standard protocol during bolus  administration of intravenous contrast.  Contrast: OMNIPAQUE IOHEXOL 300 MG/ML SOLN  Comparison: 09/20/2011  CT CHEST  Findings: The chest wall is unremarkable and stable. No  supraclavicular or axillary mass or adenopathy. The bony thorax is  intact. The thyroid gland is normal.  The heart is normal in size. No pericardial effusion. No  mediastinal or hilar lymphadenopathy. The esophagus is grossly  normal. The aorta is normal in caliber. No dissection.  Examination of the lung parenchyma demonstrates no acute pulmonary  findings. No pulmonary nodules or masses. No pleural effusion.  IMPRESSION:  Unremarkable and stable CT appearance of the chest. No findings  for lymphoma.  CT ABDOMEN AND PELVIS  Findings: The solid abdominal organs are normal. No splenomegaly.  The gallbladder is normal. No common bile duct dilatation.  The stomach, duodenum, small bowel and colon are unremarkable. The  appendix is normal. No mesenteric or retroperitoneal mass or  adenopathy. The aorta is normal in caliber. The major branch  vessels are normal.  The bladder, prostate gland and seminal vesicles are unremarkable.  No pelvic adenopathy. No inguinal adenopathy.  The bony structures are unremarkable.  IMPRESSION:  Unremarkable and stable CT appearance of the abdomen/pelvis. No  findings for lymphadenopathy.

## 2012-05-30 NOTE — Telephone Encounter (Signed)
gv pt appt schedule for September.  °

## 2012-06-08 NOTE — Progress Notes (Signed)
  CC: Maryln Gottron, M.D. Ines Bloomer, M.D. Mauri Brooklyn, MD, Fax [phone]   DIAGNOSIS:  A 22 year old male with high-grade B-cell lymphoma of the mediastinum and pericardium.  PAST THERAPY.:   1. Status post 6 cycles of CHOP-R administered from 01/15/2009 to 05/02/2009. 2. Status post radiation therapy administered from 06/20/2009 to 07/21/2009 to a total of 3170 cGy to mediastinum and 3230 cGy to the subxiphoid tumor bed.  CURRENT THERAPY:  Observation with surveillance scans.  INTERVAL HISTORY:  The patient is seen in followup today.  Clinically, he seems to be doing quite well.  He is without any significant complaints. He has not had any problems whatsoever.  He denies any fevers, chills, night sweats, weight loss.  He has not noticed any masses in his neck, under his arms or the groin region.  He has no hearing loss of sinus infections.  No urinary incontinence.  Denies any back pain, joint pain, tingling or numbness in his extremities.  He has no bleeding problems and remainder of the 10-point review of systems is negative.  CURRENT MEDICATIONS:  None.  ALLERGIES:  No known drug allergies.  PERFORMANCE STATUS:  ECOG performance status is 0.  PHYSICAL EXAMINATION:  The patient is awake, alert, well-developed, well-nourished young male in no acute distress.  Vital Signs:  stable  HEENT:  EOMI.  PERLA.  Sclerae is anicteric.  No conjunctival pallor.  Oral mucosa is moist.  Neck:  Supple.  Lungs:  Clear bilaterally to auscultation and percussion.  Cardiovascular:  Regular rate and rhythm.  No murmurs, gallops or rubs.  Abdomen:  Soft, nontender, nondistended.  Bowel sounds are present.  No HSM.  Extremities:  No clubbing, edema or cyanosis.  Neuro:  Patient is alert, oriented x3.  Strength is symmetrical in upper and lower extremities.  LABORATORY DATA:  WBC 6.0, hemoglobin 15.5, hematocrit 44.6, platelets are 196,000.  RADIOGRAPHIC STUDIES:  CT of the chest, abdomen and pelvis  performed revealed no evidence of recurrent disease.  IMPRESSION AND PLAN:  A 22 year old gentleman with stage II high-grade B-cell lymphoma with mediastinal and pericardial involvement.  He received CHOP-R chemotherapy followed by consolidative radiation therapy.  All of his treatment was completed in April 2011.  His restaging scan revealed no evidence of recurrent disease.  The   patient overall seems to be doing extremely well.  He will continue to be seen every 6 months for the first 5 years.  We will do surveillance scans on his next visit as well.  Drue Second, MD Medical/Oncology [facility] [phone] (beeper) [phone] (Office)

## 2012-12-08 ENCOUNTER — Ambulatory Visit (HOSPITAL_BASED_OUTPATIENT_CLINIC_OR_DEPARTMENT_OTHER): Payer: 59 | Admitting: Oncology

## 2012-12-08 ENCOUNTER — Telehealth: Payer: Self-pay | Admitting: Oncology

## 2012-12-08 ENCOUNTER — Other Ambulatory Visit (HOSPITAL_BASED_OUTPATIENT_CLINIC_OR_DEPARTMENT_OTHER): Payer: 59 | Admitting: Lab

## 2012-12-08 VITALS — BP 133/79 | HR 90 | Temp 97.9°F | Resp 18 | Ht 68.0 in | Wt 154.6 lb

## 2012-12-08 DIAGNOSIS — Z87898 Personal history of other specified conditions: Secondary | ICD-10-CM

## 2012-12-08 DIAGNOSIS — C8592 Non-Hodgkin lymphoma, unspecified, intrathoracic lymph nodes: Secondary | ICD-10-CM

## 2012-12-08 DIAGNOSIS — C8299 Follicular lymphoma, unspecified, extranodal and solid organ sites: Secondary | ICD-10-CM

## 2012-12-08 LAB — CBC WITH DIFFERENTIAL/PLATELET
BASO%: 0.3 % (ref 0.0–2.0)
Basophils Absolute: 0 10*3/uL (ref 0.0–0.1)
EOS%: 0.6 % (ref 0.0–7.0)
HCT: 48.7 % (ref 38.4–49.9)
HGB: 16.8 g/dL (ref 13.0–17.1)
LYMPH%: 24 % (ref 14.0–49.0)
MCH: 31.3 pg (ref 27.2–33.4)
MCHC: 34.4 g/dL (ref 32.0–36.0)
NEUT%: 67.3 % (ref 39.0–75.0)
Platelets: 245 10*3/uL (ref 140–400)
WBC: 8.1 10*3/uL (ref 4.0–10.3)
lymph#: 1.9 10*3/uL (ref 0.9–3.3)

## 2012-12-08 LAB — COMPREHENSIVE METABOLIC PANEL (CC13)
Albumin: 4.4 g/dL (ref 3.5–5.0)
Alkaline Phosphatase: 75 U/L (ref 40–150)
BUN: 9.9 mg/dL (ref 7.0–26.0)
Calcium: 10.2 mg/dL (ref 8.4–10.4)
Chloride: 105 mEq/L (ref 98–109)
Creatinine: 1 mg/dL (ref 0.7–1.3)
Glucose: 80 mg/dl (ref 70–140)
Potassium: 4.5 mEq/L (ref 3.5–5.1)

## 2012-12-08 NOTE — Progress Notes (Signed)
  CC: Maryln Gottron, M.D. Ines Bloomer, M.D. Mauri Brooklyn, MD, Fax [phone]   DIAGNOSIS:  A 22 year old male with high-grade B-cell lymphoma of the mediastinum and pericardium.  PAST THERAPY.:   1. Status post 6 cycles of CHOP-R administered from 01/15/2009 to 05/02/2009. 2. Status post radiation therapy administered from 06/20/2009 to 07/21/2009 to a total of 3170 cGy to mediastinum and 3230 cGy to the subxiphoid tumor bed.  CURRENT THERAPY:  Observation with surveillance scans.  INTERVAL HISTORY:  The patient is seen in followup today.  Clinically, he seems to be doing quite well.  He is without any significant complaints. He has not had any problems whatsoever.  He denies any fevers, chills, night sweats, weight loss.  He has not noticed any masses in his neck, under his arms or the groin region.  He has no hearing loss of sinus infections.  No urinary incontinence.  Denies any back pain, joint pain, tingling or numbness in his extremities.  He has no bleeding problems and remainder of the 10-point review of systems is negative.  CURRENT MEDICATIONS:  None.  ALLERGIES:  No known drug allergies.  PERFORMANCE STATUS:  ECOG performance status is 0.  PHYSICAL EXAMINATION:  The patient is awake, alert, well-developed, well-nourished young male in no acute distress.  Vital Signs:  stable  HEENT:  EOMI.  PERLA.  Sclerae is anicteric.  No conjunctival pallor.  Oral mucosa is moist.  Neck:  Supple.  Lungs:  Clear bilaterally to auscultation and percussion.  Cardiovascular:  Regular rate and rhythm.  No murmurs, gallops or rubs.  Abdomen:  Soft, nontender, nondistended.  Bowel sounds are present.  No HSM.  Extremities:  No clubbing, edema or cyanosis.  Neuro:  Patient is alert, oriented x3.  Strength is symmetrical in upper and lower extremities.  LABORATORY DATA:  WBC 6.0, hemoglobin 15.5, hematocrit 44.6, platelets are 196,000.  RADIOGRAPHIC STUDIES:  CT of the chest, abdomen and pelvis  performed revealed no evidence of recurrent disease.  IMPRESSION AND PLAN:  A 22 year old gentleman with stage II high-grade B-cell lymphoma with mediastinal and pericardial involvement.  He received CHOP-R chemotherapy followed by consolidative radiation therapy.  All of his treatment was completed in April 2011.  His restaging scan revealed no evidence of recurrent disease.  The patient overall seems to be doing extremely well.  He will continue to be seen every 6 months for the first 5 years.  We will do surveillance scans on his next visit as well.  Drue Second, MD Medical/Oncology [facility] [phone] (beeper) [phone] (Office)

## 2012-12-08 NOTE — Telephone Encounter (Signed)
, °

## 2012-12-08 NOTE — Patient Instructions (Addendum)
Doing well, no clinical evidence of cancer  Restaging scans prior to next visit  I will see you back in 6 months

## 2013-02-05 ENCOUNTER — Other Ambulatory Visit: Payer: Self-pay

## 2013-06-01 ENCOUNTER — Encounter (HOSPITAL_COMMUNITY): Payer: Self-pay

## 2013-06-01 ENCOUNTER — Other Ambulatory Visit (HOSPITAL_BASED_OUTPATIENT_CLINIC_OR_DEPARTMENT_OTHER): Payer: 59

## 2013-06-01 ENCOUNTER — Ambulatory Visit (HOSPITAL_COMMUNITY)
Admission: RE | Admit: 2013-06-01 | Discharge: 2013-06-01 | Disposition: A | Discharge status: Home / Self Care | Payer: 59 | Source: Ambulatory Visit | Attending: Oncology | Admitting: Oncology

## 2013-06-01 DIAGNOSIS — C8299 Follicular lymphoma, unspecified, extranodal and solid organ sites: Secondary | ICD-10-CM

## 2013-06-01 DIAGNOSIS — C8589 Other specified types of non-Hodgkin lymphoma, extranodal and solid organ sites: Secondary | ICD-10-CM | POA: Insufficient documentation

## 2013-06-01 LAB — CBC WITH DIFFERENTIAL/PLATELET
BASO%: 0.3 % (ref 0.0–2.0)
Basophils Absolute: 0 10*3/uL (ref 0.0–0.1)
EOS%: 2.9 % (ref 0.0–7.0)
Eosinophils Absolute: 0.2 10*3/uL (ref 0.0–0.5)
HCT: 45.6 % (ref 38.4–49.9)
HGB: 15.5 g/dL (ref 13.0–17.1)
LYMPH%: 30.4 % (ref 14.0–49.0)
MCH: 30.9 pg (ref 27.2–33.4)
MCHC: 34.1 g/dL (ref 32.0–36.0)
MCV: 90.8 fL (ref 79.3–98.0)
MONO#: 0.5 10*3/uL (ref 0.1–0.9)
MONO%: 7.5 % (ref 0.0–14.0)
NEUT#: 4.1 10*3/uL (ref 1.5–6.5)
NEUT%: 58.9 % (ref 39.0–75.0)
PLATELETS: 217 10*3/uL (ref 140–400)
RBC: 5.02 10*6/uL (ref 4.20–5.82)
RDW: 12.7 % (ref 11.0–14.6)
WBC: 6.9 10*3/uL (ref 4.0–10.3)
lymph#: 2.1 10*3/uL (ref 0.9–3.3)

## 2013-06-01 LAB — COMPREHENSIVE METABOLIC PANEL (CC13)
ALK PHOS: 86 U/L (ref 40–150)
ALT: 37 U/L (ref 0–55)
AST: 29 U/L (ref 5–34)
Albumin: 4.5 g/dL (ref 3.5–5.0)
Anion Gap: 10 mEq/L (ref 3–11)
BILIRUBIN TOTAL: 0.5 mg/dL (ref 0.20–1.20)
BUN: 16 mg/dL (ref 7.0–26.0)
CO2: 27 mEq/L (ref 22–29)
CREATININE: 1 mg/dL (ref 0.7–1.3)
Calcium: 9.9 mg/dL (ref 8.4–10.4)
Chloride: 104 mEq/L (ref 98–109)
Glucose: 89 mg/dl (ref 70–140)
Potassium: 4.1 mEq/L (ref 3.5–5.1)
SODIUM: 141 meq/L (ref 136–145)
TOTAL PROTEIN: 7.3 g/dL (ref 6.4–8.3)

## 2013-06-01 LAB — LACTATE DEHYDROGENASE (CC13): LDH: 191 U/L (ref 125–245)

## 2013-06-01 MED ORDER — IOHEXOL 300 MG/ML  SOLN
100.0000 mL | Freq: Once | INTRAMUSCULAR | Status: AC | PRN
  Administered 2013-06-01: 100 mL via INTRAVENOUS

## 2013-06-04 ENCOUNTER — Telehealth: Payer: Self-pay | Admitting: Adult Health

## 2013-06-04 NOTE — Telephone Encounter (Signed)
, °

## 2013-06-08 ENCOUNTER — Ambulatory Visit: Payer: 59 | Admitting: Oncology

## 2013-06-08 ENCOUNTER — Telehealth: Payer: Self-pay | Admitting: *Deleted

## 2013-06-08 NOTE — Telephone Encounter (Signed)
Left message on patient's voice mail. Per Dr Humphrey Rolls- scans look good.

## 2013-06-25 ENCOUNTER — Ambulatory Visit: Payer: 59 | Admitting: Adult Health
